# Patient Record
Sex: Female | Born: 1985 | Race: White | Hispanic: No | Marital: Single | Smoking: Current some day smoker
Health system: Southern US, Community
[De-identification: ages and names within clinical notes are randomized; demographics above are authoritative.]

## PROBLEM LIST (undated history)

## (undated) DIAGNOSIS — Z3490 Encounter for supervision of normal pregnancy, unspecified, unspecified trimester: Secondary | ICD-10-CM

## (undated) DIAGNOSIS — B1920 Unspecified viral hepatitis C without hepatic coma: Secondary | ICD-10-CM

---

## 2016-02-23 ENCOUNTER — Encounter (HOSPITAL_BASED_OUTPATIENT_CLINIC_OR_DEPARTMENT_OTHER): Payer: Self-pay | Admitting: Emergency Medicine

## 2016-02-23 ENCOUNTER — Emergency Department (HOSPITAL_BASED_OUTPATIENT_CLINIC_OR_DEPARTMENT_OTHER): Payer: Medicaid Other

## 2016-02-23 ENCOUNTER — Emergency Department (HOSPITAL_BASED_OUTPATIENT_CLINIC_OR_DEPARTMENT_OTHER)
Admission: EM | Admit: 2016-02-23 | Discharge: 2016-02-23 | Disposition: A | Discharge status: Home / Self Care | Payer: Medicaid Other | Attending: Emergency Medicine | Admitting: Emergency Medicine

## 2016-02-23 DIAGNOSIS — Z3491 Encounter for supervision of normal pregnancy, unspecified, first trimester: Secondary | ICD-10-CM

## 2016-02-23 DIAGNOSIS — Z3A01 Less than 8 weeks gestation of pregnancy: Secondary | ICD-10-CM | POA: Diagnosis not present

## 2016-02-23 DIAGNOSIS — O99321 Drug use complicating pregnancy, first trimester: Secondary | ICD-10-CM | POA: Diagnosis not present

## 2016-02-23 DIAGNOSIS — R102 Pelvic and perineal pain: Secondary | ICD-10-CM | POA: Insufficient documentation

## 2016-02-23 DIAGNOSIS — F1193 Opioid use, unspecified with withdrawal: Secondary | ICD-10-CM

## 2016-02-23 DIAGNOSIS — F1123 Opioid dependence with withdrawal: Secondary | ICD-10-CM | POA: Diagnosis not present

## 2016-02-23 DIAGNOSIS — E876 Hypokalemia: Secondary | ICD-10-CM | POA: Diagnosis not present

## 2016-02-23 DIAGNOSIS — F17200 Nicotine dependence, unspecified, uncomplicated: Secondary | ICD-10-CM | POA: Diagnosis not present

## 2016-02-23 HISTORY — DX: Unspecified viral hepatitis C without hepatic coma: B19.20

## 2016-02-23 HISTORY — DX: Encounter for supervision of normal pregnancy, unspecified, unspecified trimester: Z34.90

## 2016-02-23 LAB — COMPREHENSIVE METABOLIC PANEL
ALBUMIN: 4.1 g/dL (ref 3.5–5.0)
ALK PHOS: 43 U/L (ref 38–126)
ALT: 21 U/L (ref 14–54)
ANION GAP: 8 (ref 5–15)
AST: 22 U/L (ref 15–41)
BILIRUBIN TOTAL: 0.4 mg/dL (ref 0.3–1.2)
BUN: 5 mg/dL — AB (ref 6–20)
CALCIUM: 8.9 mg/dL (ref 8.9–10.3)
CO2: 22 mmol/L (ref 22–32)
Chloride: 107 mmol/L (ref 101–111)
Creatinine, Ser: 0.54 mg/dL (ref 0.44–1.00)
GFR calc Af Amer: >60 mL/min (ref >60)
GFR calc non Af Amer: >60 mL/min (ref >60)
GLUCOSE: 101 mg/dL — AB (ref 65–99)
Potassium: 3.3 mmol/L — ABNORMAL LOW (ref 3.5–5.1)
SODIUM: 137 mmol/L (ref 135–145)
TOTAL PROTEIN: 7.3 g/dL (ref 6.5–8.1)

## 2016-02-23 LAB — CBC WITH DIFFERENTIAL/PLATELET
BASOS ABS: 0 10*3/uL (ref 0.0–0.1)
BASOS PCT: 0 %
EOS ABS: 0.1 10*3/uL (ref 0.0–0.7)
Eosinophils Relative: 1 %
HEMATOCRIT: 33.6 % — AB (ref 36.0–46.0)
HEMOGLOBIN: 11.1 g/dL — AB (ref 12.0–15.0)
Lymphocytes Relative: 25 %
Lymphs Abs: 1.9 10*3/uL (ref 0.7–4.0)
MCH: 28.4 pg (ref 26.0–34.0)
MCHC: 33 g/dL (ref 30.0–36.0)
MCV: 85.9 fL (ref 78.0–100.0)
Monocytes Absolute: 0.5 10*3/uL (ref 0.1–1.0)
Monocytes Relative: 6 %
NEUTROS ABS: 5.1 10*3/uL (ref 1.7–7.7)
NEUTROS PCT: 68 %
Platelets: 143 10*3/uL — ABNORMAL LOW (ref 150–400)
RBC: 3.91 MIL/uL (ref 3.87–5.11)
RDW: 13.9 % (ref 11.5–15.5)
WBC: 7.5 10*3/uL (ref 4.0–10.5)

## 2016-02-23 LAB — HCG, QUANTITATIVE, PREGNANCY: hCG, Beta Chain, Quant, S: 85080 m[IU]/mL — ABNORMAL HIGH (ref <5)

## 2016-02-23 LAB — ETHANOL: Alcohol, Ethyl (B): <5 mg/dL (ref <5)

## 2016-02-23 MED ORDER — LORAZEPAM 2 MG/ML IJ SOLN
1.0000 mg | Freq: Once | INTRAMUSCULAR | Status: AC
  Administered 2016-02-23: 1 mg via INTRAVENOUS
  Filled 2016-02-23: qty 1

## 2016-02-23 MED ORDER — LORAZEPAM 1 MG PO TABS
1.0000 mg | ORAL_TABLET | Freq: Once | ORAL | Status: AC
Start: 2016-02-23 — End: 2016-02-23
  Administered 2016-02-23: 1 mg via ORAL
  Filled 2016-02-23: qty 1

## 2016-02-23 MED ORDER — SODIUM CHLORIDE 0.9 % IV BOLUS (SEPSIS)
1000.0000 mL | Freq: Once | INTRAVENOUS | Status: AC
Start: 2016-02-23 — End: 2016-02-23
  Administered 2016-02-23: 1000 mL via INTRAVENOUS

## 2016-02-23 MED ORDER — POTASSIUM CHLORIDE CRYS ER 20 MEQ PO TBCR
40.0000 meq | EXTENDED_RELEASE_TABLET | Freq: Once | ORAL | Status: DC
  Filled 2016-02-23: qty 2

## 2016-02-23 NOTE — ED Triage Notes (Signed)
[facility].  Pt left AMA last night.  Pt states she is withdrawing from Heroin and opiates.  Pt states she was on suboxone.  Pt is having shakes and nausea.  No vaginal bleeding.

## 2016-02-23 NOTE — ED Notes (Signed)
Pt refusing to take K+ pills. Won't open eyes or communicate clearly with RN. Breathing even and unlabored, VSS and in NAD.

## 2016-02-23 NOTE — ED Notes (Signed)
Pt states she is unable to give UA at this time

## 2016-02-23 NOTE — ED Provider Notes (Signed)
[facility]Y [facility] Provider Note   CSN: [id number] Arrival date & time: 02/23/16  1005  First Provider Contact:  First MD Initiated Contact with Patient 02/23/16 1012        History   Chief Complaint Chief Complaint  Patient presents with  . Withdrawal    HPI Earmon PhoenixLauralynn Hallett is a 30 y.o. female history of narcotic and heroin abuse here presenting with tremors. [facility] and was diagnosed with 7 week pregnancy. She states that she was not told anything and went back yesterday was possible withdrawal symptoms. She was updated with results and had HCG 55,000. Her UDS was negative at that time. She was suppose to see psychiatry this morning regarding detox but left AMA. She woke up this morning and had some tremors. She last used heroin about a week ago and was out of suboxone for about a week and last used roxicodone 4 days ago. Denies any alcohol use. Also has some abdominal pain but denies vaginal bleeding or discharge. Denies suicidal or homicidal ideations.   The history is provided by the patient.    Past Medical History:  Diagnosis Date  . Hepatitis C   . Pregnant     There are no active problems to display for this patient.   No past surgical history on file.  OB History    Gravida Para Term Preterm AB Living   4       1 2    SAB TAB Ectopic Multiple Live Births           2       Home Medications    Prior to Admission medications   Not on File    Family History No family history on file.  Social History Social History  Substance Use Topics  . Smoking status: Current Some Day Smoker  . Smokeless tobacco: Never Used  . Alcohol use No     Allergies   Ceclor [cefaclor]   Review of Systems Review of Systems  Neurological: Positive for tremors.  All other systems reviewed and are negative.    Physical Exam Updated Vital Signs BP 104/64   Pulse 71   Temp 98.2 F (36.8 C) (Oral)   Resp 16   Ht 5\' 4"   (1.626 m)   Wt 117 lb (53.1 kg)   LMP  (LMP Unknown) Comment: 7 weeks 4 days approx  SpO2 100%   BMI 20.08 kg/m   Physical Exam  Constitutional: She is oriented to person, place, and time. She appears well-developed.  Anxious, intentional tremors, improved with movement   HENT:  Head: Normocephalic.  Eyes: EOM are normal. Pupils are equal, round, and reactive to light.  Neck: Normal range of motion. Neck supple.  Cardiovascular: Normal rate, regular rhythm and normal heart sounds.   Pulmonary/Chest: Effort normal and breath sounds normal. No respiratory distress. She has no wheezes. She has no rales.  Abdominal: Soft. Bowel sounds are normal. She exhibits no distension. There is no tenderness.  Gravid uterus, nontender   Musculoskeletal: Normal range of motion.  Neurological: She is alert and oriented to person, place, and time.  Intentional tremors, improves with movement. Nl strength throughout   Skin: Skin is warm.  Psychiatric:  Anxious   Nursing note and vitals reviewed.    ED Treatments / Results  Labs (all labs ordered are listed, but only abnormal results are displayed) Labs Reviewed  CBC WITH DIFFERENTIAL/PLATELET - Abnormal; Notable for the  following:       Result Value   Hemoglobin 11.1 (*)    HCT 33.6 (*)    Platelets 143 (*)    All other components within normal limits  COMPREHENSIVE METABOLIC PANEL - Abnormal; Notable for the following:    Potassium 3.3 (*)    Glucose, Bld 101 (*)    BUN 5 (*)    All other components within normal limits  HCG, QUANTITATIVE, PREGNANCY - Abnormal; Notable for the following:    hCG, Beta Chain, Quant, S 85,080 (*)    All other components within normal limits  ETHANOL  URINALYSIS, ROUTINE W REFLEX MICROSCOPIC (NOT AT [facility])  URINE RAPID DRUG SCREEN, HOSP PERFORMED    EKG  EKG Interpretation None       Radiology US Ob Comp Less 14 Wks  Result Date: 02/23/2016 CLINICAL DATA:  C/o generalized pelvic pain but unsure of  how long. Patient is currently in withdrawal from heroin/opiates. Was seen at [facility] facility 02/20/16 and had U/S done. Patient is refusing a transvaginal exam. EXAM: OBSTETRIC <14 WK ULTRASOUND TECHNIQUE: Transabdominal ultrasound was performed for evaluation of the gestation as well as the maternal uterus and adnexal regions. COMPARISON:  None. FINDINGS: Intrauterine gestational sac: Yes Yolk sac:  Yes Embryo:  Yes Cardiac Activity: Yes Heart Rate: 162 bpm CRL:   14  mm   7 w 5 d                  [place] EDC: 10/06/2016 Subchorionic hemorrhage: Small to moderate subchronic hemorrhage. This lies along the right margin of the gestational sac. Maternal uterus/adnexae: It no uterine masses. Cervix is closed. Normal ovaries and adnexa. No pelvic free fluid. IMPRESSION: 1. Single live intrauterine pregnancy with a measured gestational age of [redacted] weeks and 5 days. 2. Small to moderate subchorionic hemorrhage. No other pregnancy complication. Electronically Signed   By: Amie Portland M.D.   On: 02/23/2016 12:45    Procedures Procedures (including critical care time)  Medications Ordered in ED Medications  potassium chloride SA (K-DUR,KLOR-CON) CR tablet 40 mEq (0 mEq Oral Hold 02/23/16 1237)  sodium chloride 0.9 % bolus 1,000 mL (1,000 mLs Intravenous New Bag/Given 02/23/16 1054)  LORazepam (ATIVAN) injection 1 mg (1 mg Intravenous Given 02/23/16 1053)     Initial Impression / Assessment and Plan / ED Course  I have reviewed the triage vital signs and the nursing notes.  Pertinent labs & imaging results that were available during my care of the patient were reviewed by me and considered in my medical decision making (see chart for details).  Clinical Course    [place] is a 30 y.o. female about [redacted] weeks pregnant here with possible withdrawal. [facility] and 8/7 visits. Her UDS was negative. HCG 55,000, US showed 7 week pregnancy.   11:24 am  TTS called me and state that  patient doesn't need to be seen by TTS since she is not suicidal. But given that she is pregnant, will need meds to either prevent withdrawals or may need inpatient admission for detox. I talked to Dr. Jama Flavors in [facility]. He recommend either clonidine if she wants to detox vs methadone but then baby needs monitoring at birth for detox vs subutax. He recommend transfer to [facility] for psych to see patient and discuss further options.   12:56 PM HCG 85,000. [place] confirmed IUP. Discussed with Dr. Jeraldine Loots in the ED at [facility], who accepted transfer.  Patient will see psych there to discuss options.    Final Clinical Impressions(s) / ED Diagnoses   Final diagnoses:  None    New Prescriptions New Prescriptions   No medications on file     Charlynne Pander, MD 02/23/16 1257

## 2016-02-23 NOTE — BH Assessment (Signed)
Writer spoke to Dr. Thurnell LoseYow and he is requesting a Tele Psych for medication recommendations for the patient.

## 2016-02-23 NOTE — BH Assessment (Addendum)
Contacted Dr. Parke Poisson to discuss plan of care for this patient. Dr. Parke Poisson suggested that this writer first ask patient her wishes for outcome and treatment. The next step recommended would be to provide patient with (2) options for treatment:   (1) If patient is agreeable to inpatient care, Dr. Parke Poisson will take her as his patient on the unit at [facility] for [place] detox treatment. This Inpatient admission would also have to be ran by medical director Dr. Dwyane Dee to see if she concurred. Dr. Parke Poisson also stated that patient would also have to agree with the medications offered to help her detox (Clonodine or a systematic release such as Ativan).  (2) Patient would be given outpatient referrals to local and community clinics to meet her needs (methodone treatment facilities, Suboxone, CD-IOP, etc.).  Writer met with patient face to face to discuss options for treatment. Writer explained to patient her options of treatment. Patient refused inpatient treatment at [facility] stating, "I need stronger medications than Clonodine or Ativan...Marland KitchenMarland KitchenThose medications are just for anxiety and it's not going to do anything for my situation". Writer informed patient that her other option would be outpatient referrals. Patient was agreeable to discharge home with outpatient referrals. Patient sts that she wants Methodone or Suboxone stating, "I need something that will actually work". Writer tried to make contact with the providers below however all offices were closed. Writer then provided patient with the following referrals and she was agreeable to following up on her own.  Due to patient presenting with straight substance abuse needs and no complaints of SI, HI, and AVH's the EDP was agreeable to remove the TTS consult.       [place]    [facility] [place] [address]   [address] [place], [place]  [address] [phone]     [phone] *Accepts MCD     Monday - Thursday Otis Dials; Friday 10AM-4PM Closed Saturdays & Sundays  [facility] - inpatient facility [address] [place] [address]   [place] [phone]      Monday - Friday 5AM - 10AM Saturday 6AM-830AM; Sunday 6AM - 7AM Intakes: Monday-Fridays 530AM - 930AM                                                                                  In [place]  [facility]  [facility] [address]                           Dr. Khan [place] [address]                                           [address] [phone]      [place] [address]        [phone]  Psychiatrists/Outpatient Suboxone Treatement Dr. Brayton Caves [facility] [address]  [place] [address] [phone]

## 2016-02-23 NOTE — ED Notes (Signed)
Pt is stating to [facility] RN that she has not been told where she is going and why or anything about her plan of care, she thought she was being admitted at this facility. Dr. Silverio LayYao went into her room approx 30 min ago and updated pt on all of these things and informed her there is no psychiatry services or inpatient tx here with this RN as a witness. At the time, pt agreed to plan of care. After speaking to pt again, she is agreeable to transfer.

## 2016-02-23 NOTE — ED Notes (Signed)
TTS computer placed in room. 

## 2016-02-23 NOTE — ED Notes (Signed)
Pt provided with ginger ale, ham sandwich, and cheese stick.

## 2016-02-23 NOTE — ED Notes (Signed)
MD at bedside. 

## 2016-02-23 NOTE — B[facility] Counselor (Signed)
[facility] Assessment Progress Note  Writer spoke to Dr. Lucianne MussKumar regarding the request for med recommendations. She indicated that Dr. Jama Flavorsobos would have to provide the med recommendations due to being addictions certified. Writer called Dr. Jama Flavorsobos, explaining the request for med recommendations and giving him the number for EDP Yao. Dr. Jama Flavorsobos agreed to call EDP.  Johny ShockSamantha M. Ladona Ridgelaylor, MS, NCC, LPCA Counselor

## 2016-02-23 NOTE — ED Notes (Signed)
Went to get transfer consent from pt, after arousing she questioned [facility] Dr. Silverio LayYao went in to update pt, and states pt has agreed to do "whatever you think is best". [facility] is en route to pick pt up.

## 2016-02-23 NOTE — ED Provider Notes (Signed)
Assumed care from Dr. Silverio LayYao. Patient transferred from [facility] for [facility]. 30 year old female with past medical history of polysubstance abuse including heroin abuse who presents with desire for heroin detoxification. This is complicated by recent diagnosis of pregnancy. Patient initially seen at [facility] with unremarkable screening labs. Ultrasound shows single live IUP. Psychiatry was consulted and recommended transfer for psych evaluation. On arrival, patient is without complaints. HDS. Labs and imaging reviewed.  Discussed case with TTS as well as [facility]. At this time, patient adamantly denies any SI, HI, or AVH. She does not appear intoxicated and demonstrates good insight. She does not meet IVC criteria. I discussed outpatient management [facility] with [facility]. Jama Flavorsobos and TTS, who has discussed with Dr. Lucianne MussKumar. We have [facility] but she is not interested. We also discussed potential establishment of methadone wean with overnight observation but patient is not interested in methadone use. I discussed the risks of continuing suboxone/heroin use with pregnancy including risk of loss of pregnancy and pt expresses understanding. She is requesting discharge with outpatient follow-up. SW has provided with resources. Given otherwise stable vitals, well appearance, and lack of any IVC criteria, will discharge.   Brittany Pollackameron Kendel Pesnell, MD 02/24/16 1145

## 2016-08-18 IMAGING — US US OB COMP LESS 14 WK
1 series · 14 of 28 positions shown · non-contrast
Comparison: None.

CLINICAL DATA: C/o generalized pelvic pain but unsure of how long.
Patient is currently in withdrawal from heroin/opiates. Was seen at
[HOSPITAL] facility 02/20/16 and had U/S done. Patient is refusing a
transvaginal exam.

EXAM:
OBSTETRIC <14 WK ULTRASOUND
TECHNIQUE: Transabdominal ultrasound was performed for evaluation of the
gestation as well as the maternal uterus and adnexal regions.

[Series 1: us ob comp less 14 wk · 0.20mm/px · 14 of 39 slices shown]
[im 2/39]
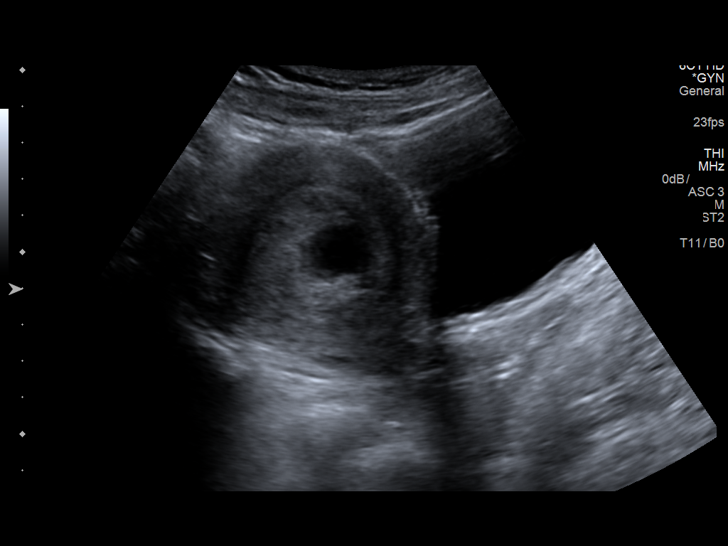
[im 5/39]
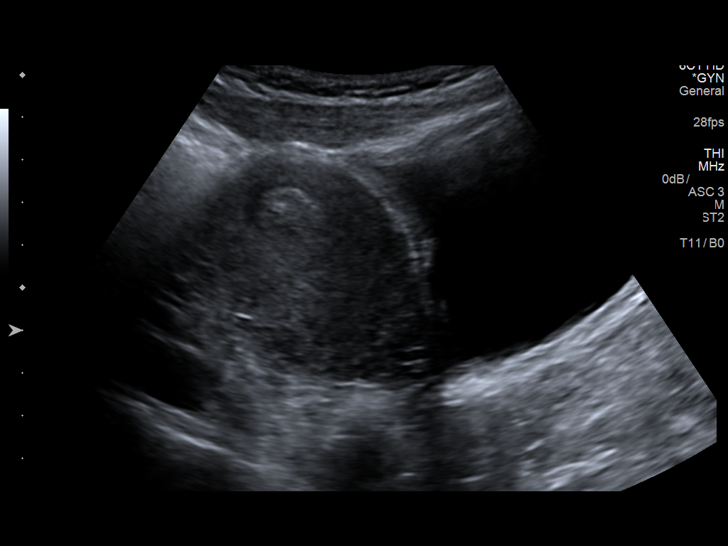
[im 8/39]
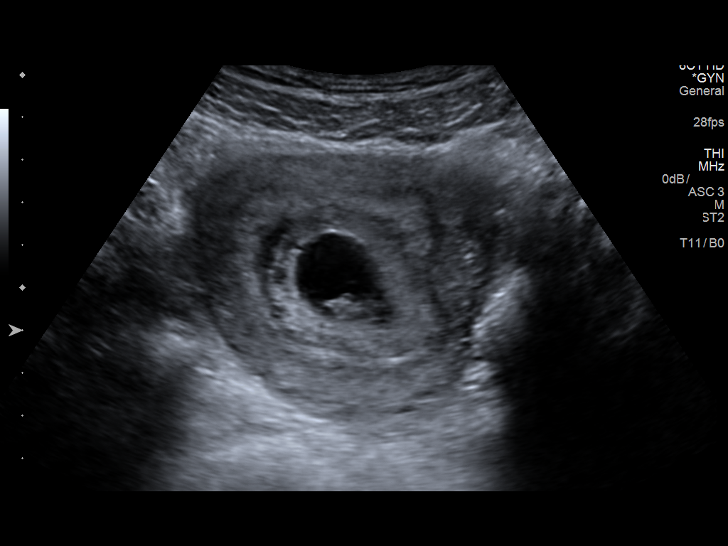
[im 10/39]
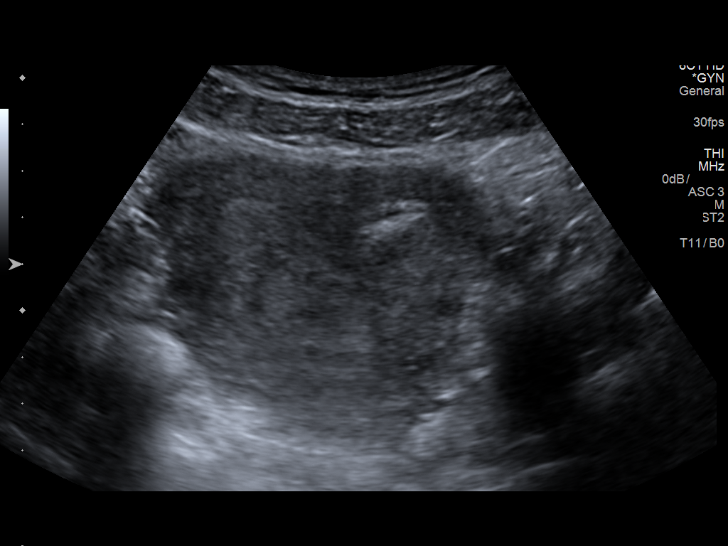
[im 13/39]
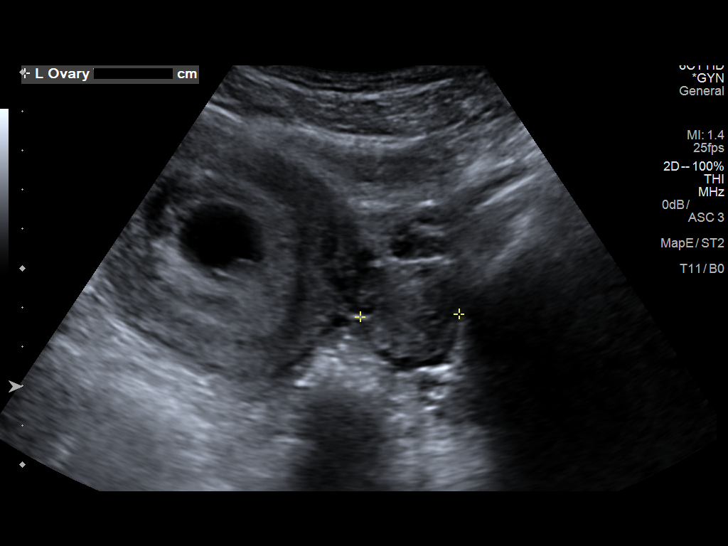
[im 16/39]
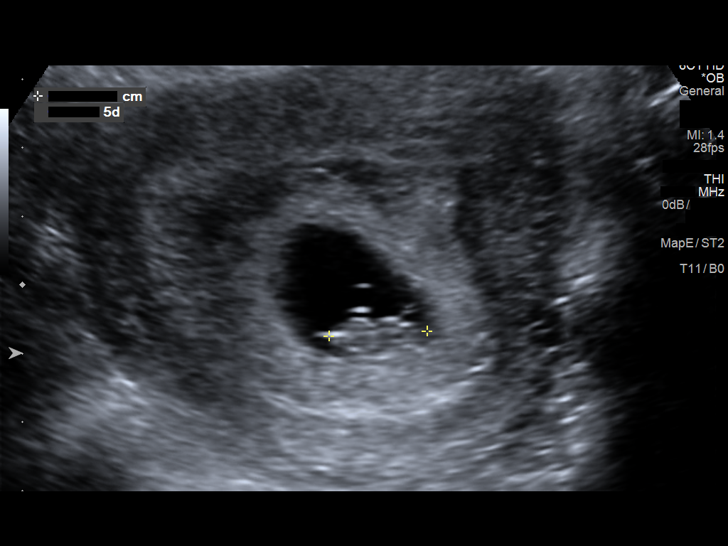
[im 19/39]
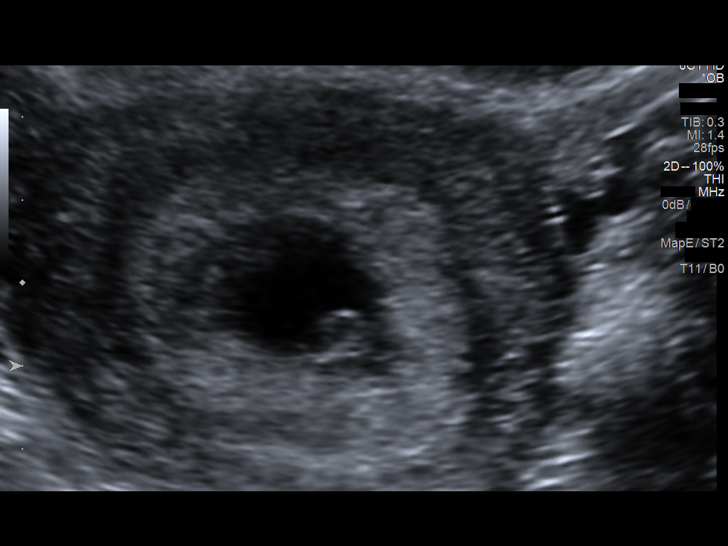
[im 22/39]
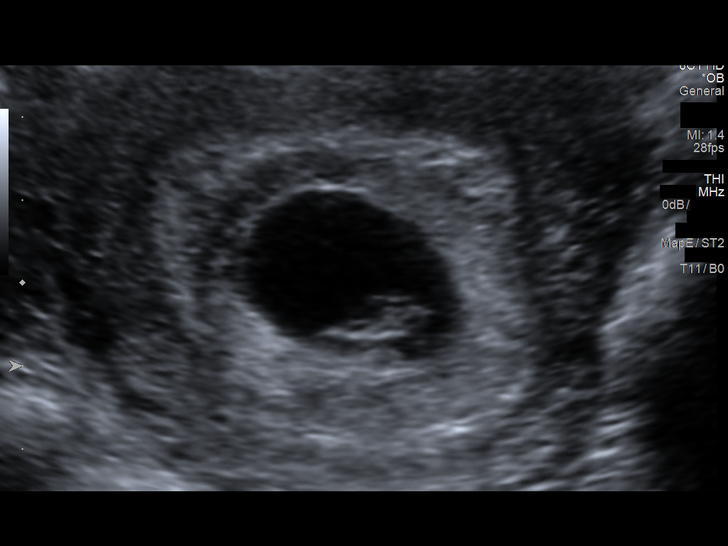
[im 24/39]
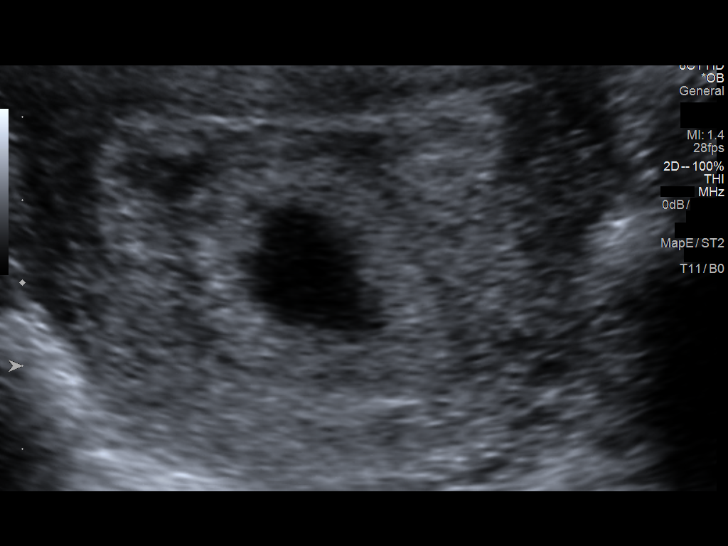
[im 27/39]
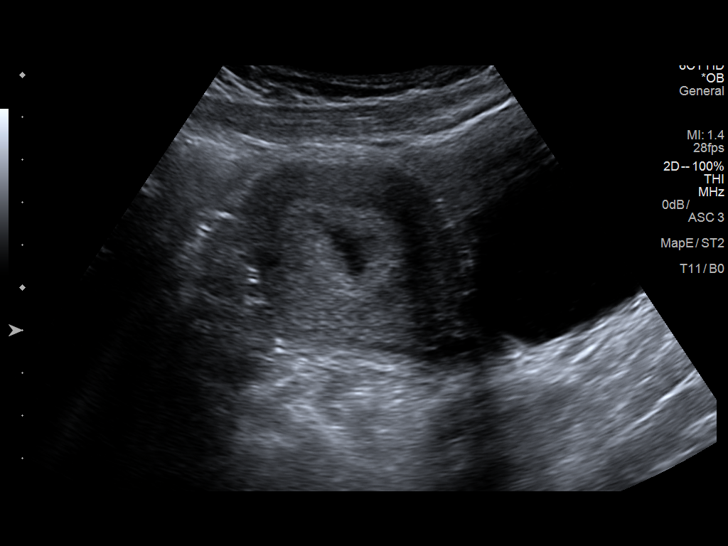
[im 30/39]
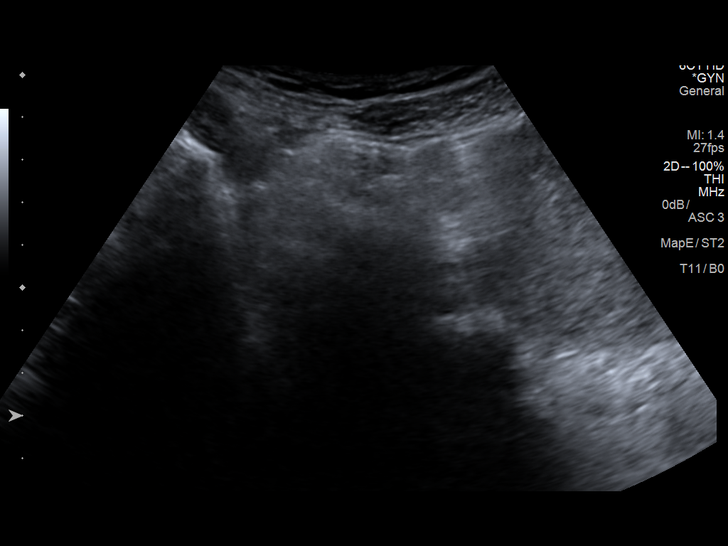
[im 33/39]
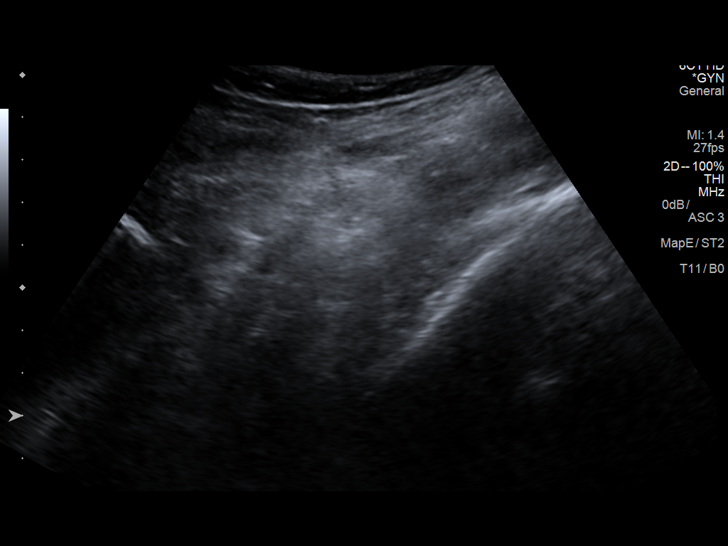
[im 36/39]
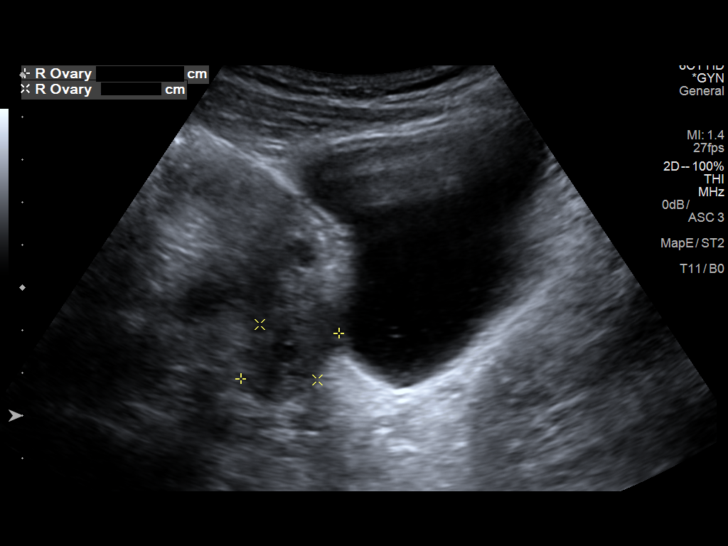
[im 39/39]
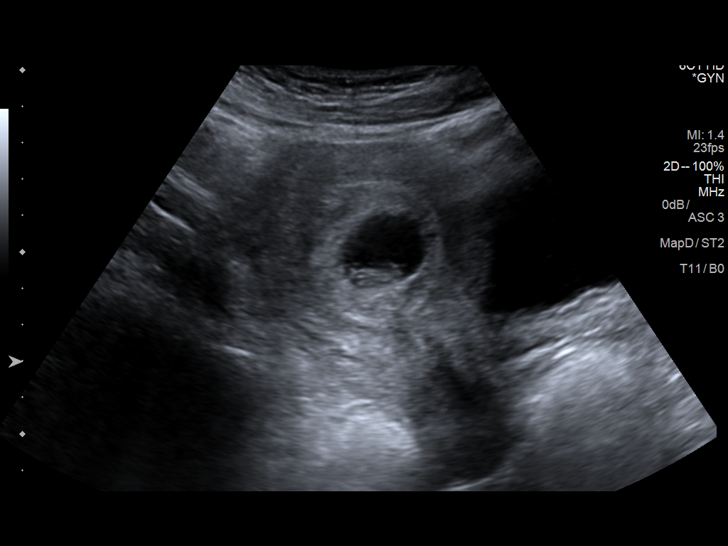

[14 of 28 positions shown; findings below may reference images not displayed]

FINDINGS: Intrauterine gestational sac: Yes

Yolk sac:  Yes

Embryo:  Yes

Cardiac Activity: Yes

Heart Rate: 162 bpm

CRL:   14  mm   7 w 5 d                  US EDC: 10/06/2016

Subchorionic hemorrhage: Small to moderate subchronic hemorrhage.
This lies along the right margin of the gestational sac.

Maternal uterus/adnexae: It no uterine masses. Cervix is closed.
Normal ovaries and adnexa. No pelvic free fluid.
IMPRESSION: 1. Single live intrauterine pregnancy with a measured gestational
age of 7 weeks and 5 days.
2. Small to moderate subchorionic hemorrhage. No other pregnancy
complication.
